# Patient Record
Sex: Female | Born: 1970 | Race: White | Hispanic: No | Marital: Single | Smoking: Former smoker
Health system: Southern US, Community
[De-identification: ages and names within clinical notes are randomized; demographics above are authoritative.]

## PROBLEM LIST (undated history)

## (undated) DIAGNOSIS — Z8742 Personal history of other diseases of the female genital tract: Secondary | ICD-10-CM

## (undated) DIAGNOSIS — R7989 Other specified abnormal findings of blood chemistry: Secondary | ICD-10-CM

## (undated) DIAGNOSIS — G5600 Carpal tunnel syndrome, unspecified upper limb: Secondary | ICD-10-CM

## (undated) HISTORY — DX: Personal history of other diseases of the female genital tract: Z87.42

## (undated) HISTORY — PX: APPENDECTOMY: SHX54

## (undated) HISTORY — DX: Carpal tunnel syndrome, unspecified upper limb: G56.00

## (undated) HISTORY — DX: Other specified abnormal findings of blood chemistry: R79.89

---

## 1998-04-22 ENCOUNTER — Encounter: Payer: Self-pay | Admitting: Family Medicine

## 1998-04-22 ENCOUNTER — Ambulatory Visit (HOSPITAL_COMMUNITY): Admission: RE | Admit: 1998-04-22 | Discharge: 1998-04-22 | Payer: Self-pay | Admitting: Family Medicine

## 1999-12-31 ENCOUNTER — Emergency Department (HOSPITAL_COMMUNITY): Admission: EM | Admit: 1999-12-31 | Discharge: 1999-12-31 | Payer: Self-pay | Admitting: *Deleted

## 2000-09-15 ENCOUNTER — Emergency Department (HOSPITAL_COMMUNITY): Admission: EM | Admit: 2000-09-15 | Discharge: 2000-09-15 | Payer: Self-pay | Admitting: *Deleted

## 2000-09-27 ENCOUNTER — Emergency Department (HOSPITAL_COMMUNITY): Admission: EM | Admit: 2000-09-27 | Discharge: 2000-09-27 | Payer: Self-pay | Admitting: Emergency Medicine

## 2000-11-01 ENCOUNTER — Emergency Department (HOSPITAL_COMMUNITY): Admission: EM | Admit: 2000-11-01 | Discharge: 2000-11-01 | Payer: Self-pay | Admitting: Emergency Medicine

## 2001-04-18 ENCOUNTER — Emergency Department (HOSPITAL_COMMUNITY): Admission: EM | Admit: 2001-04-18 | Discharge: 2001-04-18 | Payer: Self-pay | Admitting: Emergency Medicine

## 2001-05-24 ENCOUNTER — Emergency Department (HOSPITAL_COMMUNITY): Admission: EM | Admit: 2001-05-24 | Discharge: 2001-05-24 | Payer: Self-pay | Admitting: Emergency Medicine

## 2001-06-15 ENCOUNTER — Emergency Department (HOSPITAL_COMMUNITY): Admission: EM | Admit: 2001-06-15 | Discharge: 2001-06-15 | Payer: Self-pay | Admitting: Emergency Medicine

## 2001-06-15 ENCOUNTER — Encounter: Payer: Self-pay | Admitting: Emergency Medicine

## 2008-06-30 DIAGNOSIS — R7989 Other specified abnormal findings of blood chemistry: Secondary | ICD-10-CM

## 2008-06-30 HISTORY — DX: Other specified abnormal findings of blood chemistry: R79.89

## 2008-09-16 ENCOUNTER — Emergency Department (HOSPITAL_COMMUNITY): Admission: EM | Admit: 2008-09-16 | Discharge: 2008-09-17 | Payer: Self-pay | Admitting: Emergency Medicine

## 2009-09-04 ENCOUNTER — Emergency Department (HOSPITAL_BASED_OUTPATIENT_CLINIC_OR_DEPARTMENT_OTHER): Admission: EM | Admit: 2009-09-04 | Discharge: 2009-09-04 | Payer: Self-pay | Admitting: Emergency Medicine

## 2009-09-04 ENCOUNTER — Ambulatory Visit: Payer: Self-pay | Admitting: Radiology

## 2009-10-18 ENCOUNTER — Ambulatory Visit: Payer: Self-pay | Admitting: Nurse Practitioner

## 2009-10-18 ENCOUNTER — Inpatient Hospital Stay (HOSPITAL_COMMUNITY): Admission: AD | Admit: 2009-10-18 | Discharge: 2009-10-18 | Payer: Self-pay | Admitting: Obstetrics & Gynecology

## 2009-10-28 ENCOUNTER — Ambulatory Visit: Payer: Self-pay | Admitting: Obstetrics & Gynecology

## 2009-11-02 ENCOUNTER — Ambulatory Visit (HOSPITAL_COMMUNITY): Admission: RE | Admit: 2009-11-02 | Discharge: 2009-11-02 | Payer: Self-pay | Admitting: Obstetrics & Gynecology

## 2009-11-02 ENCOUNTER — Ambulatory Visit: Payer: Self-pay | Admitting: Obstetrics & Gynecology

## 2009-11-25 ENCOUNTER — Ambulatory Visit: Payer: Self-pay | Admitting: Obstetrics and Gynecology

## 2009-12-13 ENCOUNTER — Ambulatory Visit: Payer: Self-pay | Admitting: Family Medicine

## 2009-12-22 ENCOUNTER — Ambulatory Visit: Payer: Self-pay | Admitting: Family Medicine

## 2009-12-30 ENCOUNTER — Ambulatory Visit: Payer: Self-pay | Admitting: Obstetrics and Gynecology

## 2010-02-24 ENCOUNTER — Encounter (HOSPITAL_COMMUNITY)
Admission: RE | Admit: 2010-02-24 | Discharge: 2010-02-24 | Disposition: A | Discharge status: Home / Self Care | Payer: Self-pay | Source: Ambulatory Visit | Attending: Family Medicine | Admitting: Family Medicine

## 2010-02-24 DIAGNOSIS — Z01812 Encounter for preprocedural laboratory examination: Secondary | ICD-10-CM | POA: Insufficient documentation

## 2010-02-24 LAB — CBC
HCT: 45.6 % (ref 36.0–46.0)
Hemoglobin: 15.6 g/dL — ABNORMAL HIGH (ref 12.0–15.0)
MCHC: 34.2 g/dL (ref 30.0–36.0)
RBC: 5.07 MIL/uL (ref 3.87–5.11)
WBC: 11.1 10*3/uL — ABNORMAL HIGH (ref 4.0–10.5)

## 2010-02-24 LAB — SURGICAL PCR SCREEN
MRSA, PCR: NEGATIVE
Staphylococcus aureus: NEGATIVE

## 2010-03-03 ENCOUNTER — Ambulatory Visit (HOSPITAL_COMMUNITY)
Admission: RE | Admit: 2010-03-03 | Discharge: 2010-03-04 | Disposition: A | Discharge status: Home / Self Care | Payer: Self-pay | Source: Ambulatory Visit | Attending: Family Medicine | Admitting: Family Medicine

## 2010-03-03 ENCOUNTER — Other Ambulatory Visit: Payer: Self-pay | Admitting: Family Medicine

## 2010-03-03 ENCOUNTER — Ambulatory Visit (HOSPITAL_COMMUNITY): Payer: Self-pay

## 2010-03-03 DIAGNOSIS — N840 Polyp of corpus uteri: Secondary | ICD-10-CM | POA: Insufficient documentation

## 2010-03-03 DIAGNOSIS — N949 Unspecified condition associated with female genital organs and menstrual cycle: Secondary | ICD-10-CM

## 2010-03-03 DIAGNOSIS — E039 Hypothyroidism, unspecified: Secondary | ICD-10-CM | POA: Insufficient documentation

## 2010-03-03 DIAGNOSIS — N8300 Follicular cyst of ovary, unspecified side: Secondary | ICD-10-CM

## 2010-03-03 DIAGNOSIS — N803 Endometriosis of pelvic peritoneum, unspecified: Secondary | ICD-10-CM | POA: Insufficient documentation

## 2010-03-03 DIAGNOSIS — N200 Calculus of kidney: Secondary | ICD-10-CM | POA: Insufficient documentation

## 2010-03-03 LAB — PREGNANCY, URINE: Preg Test, Ur: NEGATIVE

## 2010-03-04 LAB — CBC
Hemoglobin: 13.5 g/dL (ref 12.0–15.0)
MCH: 30.2 pg (ref 26.0–34.0)
Platelets: 219 10*3/uL (ref 150–400)
RBC: 4.47 MIL/uL (ref 3.87–5.11)
WBC: 16.1 10*3/uL — ABNORMAL HIGH (ref 4.0–10.5)

## 2010-03-07 HISTORY — PX: ABDOMINAL HYSTERECTOMY: SHX81

## 2010-03-07 HISTORY — PX: BILATERAL OOPHORECTOMY: SHX1221

## 2010-03-07 NOTE — Discharge Summary (Signed)
  NAMECHARVI, [name]            ACCOUNT NO.:  [id number]  MEDICAL RECORD NO.:  [id number]           PATIENT TYPE:  O  LOCATION:  9306                          FACILITY:  [facility]  PHYSICIAN:  Lakevia Perris S. Shawnie Pons, M.D.   DATE OF BIRTH:  04-21-1970  DATE OF ADMISSION:  03/03/2010 DATE OF DISCHARGE:  03/04/2010                              DISCHARGE SUMMARY   FINAL DIAGNOSES: 1. Chronic pelvic pain. 2. Endometriosis. 3. Hypothyroidism. 4. Borderline personality disorder. 5. Nephrolithiasis. 6. Bipolar disorder.  PERTINENT LABS:  Negative urine pregnancy test.  Preoperative hemoglobin of 15.6, postoperative hemoglobin of 13.5.  Negative MRSA screen.  Other pertinent findings include a large globular uterus and obesity.  PROCEDURES:  The patient underwent a total laparoscopic hysterectomy with BSO.  CONSULTS:  None.  REASON FOR ADMISSION:  Briefly, please see H and P on the chart.  The patient is a 40 year old nulliparous patient who has a history of endometriosis biopsy proven by laparoscopy in October of last year.  She was also found to have adhesions as well.  The patient has continued lower pelvic pain and desired definitive treatment including bilateral salpingo-oophorectomy.  Discussion was had with the patient about risks and benefits.  The patient understood that and desires to proceed.  HOSPITAL COURSE:  The patient was admitted, she underwent the above procedure.  Please see operative report for full details. Postoperatively, she was transferred to the floor.  She had her Foley catheter and had a low dose PCA.  Overnight, she did well.  She had excellent urine output and her Foley was removed in the morning.  She was ambulating, voiding, and eating without difficulty and felt stable for discharge.  DISCHARGE DISPOSITION AND CONDITION:  The patient discharged home in good condition.  Followup will be in the GYN Clinic on March 18, 2010, at 9:30 in  the morning.  DISCHARGE MEDICATIONS:  Continuation of her diclofenac 75 mg 1-2 tablets daily, diphenhydramine 50 mg 2-4 tablets daily if needed for allergies, continuation of trazodone 75 mg at bedtime for sleep, Klonopin 1 mg at bedtime for sleep as well.  New prescriptions given are Percocet 5/325 one to two p.o. q.4-6 h. p.r.n. pain #42 given, Ultram 50 mg one every 6 hours as needed for left pain #48 plus one refill are given, estrogen patch 0.1 mg applied a patch weekly #4 with p.r.n. refills given.  The patient is also instructed to return with fever greater that 101, persistent nausea, vomiting, or significant abdominal pain.     Shelbie Proctor. Shawnie Pons, M.D.     TSP/MEDQ  D:  03/04/2010  T:  03/04/2010  Job:  [id number]  Electronically Signed by Tinnie Gens M.D. on 03/07/2010 11:02:14 AM

## 2010-03-07 NOTE — Op Note (Signed)
[name], [name]            ACCOUNT NO.:  [id number]  MEDICAL RECORD NO.:  [id number]           PATIENT TYPE:  O  LOCATION:  9306                          [facility]ILITY:  [facility]  PHYSICIAN:  Krishna Heuer S. Shawnie Pons, M.D.   DATE OF BIRTH:  [birth year]  DATE OF PROCEDURE:  03/03/2010 DATE OF DISCHARGE:  02/24/2010                              OPERATIVE REPORT   PREOPERATIVE DIAGNOSIS:  Endometriosis and chronic pelvic pain.  POSTOPERATIVE DIAGNOSIS:  Endometriosis and chronic pelvic pain.  PROCEDURE:  Total abdominal hysterectomy with bilateral salpingo- oophorectomy.  SURGEON:  Shelbie Proctor. Shawnie Pons, MD  ASSISTANTS:  Lazaro Arms, MD and Myra C. Marice Potter, MD  ANESTHESIA:  General and local.  FINDINGS:  Globular-appearing uterus, small cyst on the patient's right ovary.  SPECIMENS:  Uterus, tubes, and ovaries to Pathology.  BLOOD LOSS:  250 mL.  COMPLICATIONS:  None known.  REASON FOR PROCEDURE:  Briefly, the patient is a 40 year old nulliparous patient who has a history of endometriosis that is pathology proved by laparoscopy done in October.  She had adhesions at that time and the patient was interested in definitive treatment.  She apparently has a history of chronic pain, requiring Percocet use and she is trying to get off that if possible.  The patient was counseled regarding risks and benefits of the procedures and alternatives and the patient really desired definitive therapy.  PROCEDURE:  The patient was taken to the OR.  She was placed in dorsal lithotomy in [place] stirrups.  She was prepped and draped in usual sterile fashion with ChloraPrep on the abdomen and Betadine of the perineum.  Foley catheter was placed inside the bladder.  Time-out was performed.  The patient had received a gram of Ancef.  SCDs were in place.  Speculum was placed inside the vagina.  The cervix visualized. A ZUMI was placed through the uterus for uterine manipulation. Attention was then turned to the  abdomen.  4 mL of 0.25% Marcaine were injected at the umbilicus.  A vertical incision was made through the umbilicus to the underlying fascia and the peritoneal cavity entered bluntly.  The edges of the fascia were then tied with 0 Vicryl suture on the UR6 and Hasson trocar placed through this incision.  A pneumoperitoneum was created.  Attention was then turned to the lower quadrants where two Excel trocars of 10-mm size were placed under direct visualization of the lower quadrants bilaterally.  The large single- tooth tenaculum was then used to grasp the uterus and harmonic scalpel was used to take down the tubo-ovarian pedicles and the rounds.  This started very laterally, so that the uterine artery could be taken high on the uterus and very medially which it was.  Then attention was turned to the patient's left side where the tubo-ovarian pedicle, the round ligament was taken down.  The bladder flap was created and pushed down. The uterine artery was taken on this side and some bleeding was noted from this side, which had to be further cauterized.  2-3 bites were taken on the patient's left side to go down the cervix with the harmonic scalpel and then  the vagina was entered sharply with the harmonic scalpel.  Similarly, several bites were taken down on the cervix on the patient's right and the vagina was entered and with care being taken to hug the cervix.  The vagina was opened in sequential bites circumferentially until the uterus was removed.  Pneumoperitoneum was lost, but there is a hole the vagina and a wet towel was placed in the vagina.  The uterus was then placed over the hole and IP ligaments were taken and the tubes and ovaries removed.  These were all removed from below and a wet towel placed back into the vagina and pneumoperitoneum recreated.  An Endo suture was used to close the vaginal cuff.  The first Endo suture did not fire and never even took a bite of tissue,  so this one was removed and the suture as well.  When the suture was removed, the needle could not be found that was supposed to be attached to it.  A second Endo suture was used and the vaginal angles were gotten and one across the middle which effectively closed the vaginal cuff.  The suture being tied extracorporeally and pushed down with a knot pusher.  At the end of the case, the abdomen was inspected.  There did not appear to be any foreign bodies; however, given the inability to find the needle, an x-ray was obtained.  The x-rays revealed no foreign body in the abdomen and half of the needle was found still in the device that had misfired.  The trocars were then all removed.  Two lower quadrant ports were closed with 3-0 Vicryl in a subcuticular fashion followed by Dermabond.  The fascia at the umbilicus was closed with the aforementioned 0 Vicryl sutures on the UR6 and 2 interrupted to close the fascia there.  The subcutaneous tissue was also closed with 0 Vicryl suture in excellent subcuticular fashion followed by Dermabond.  With the exception of the half of the needle from the Endo suture device that could not be found, all other instrument, needle, and lap counts were correct x2.  The patient was awakened and taken to recovery room in stable condition.     Shelbie Proctor. Shawnie Pons, M.D.     TSP/MEDQ  D:  03/03/2010  T:  03/04/2010  Job:  [id number]  Electronically Signed by Tinnie Gens M.D. on 03/07/2010 11:01:45 AM

## 2010-03-18 ENCOUNTER — Ambulatory Visit: Payer: Self-pay | Admitting: Family Medicine

## 2010-03-22 LAB — POCT PREGNANCY, URINE: Preg Test, Ur: NEGATIVE

## 2010-03-23 LAB — CBC
HCT: 42.8 % (ref 36.0–46.0)
Hemoglobin: 14.7 g/dL (ref 12.0–15.0)
MCHC: 34.3 g/dL (ref 30.0–36.0)
WBC: 13.7 10*3/uL — ABNORMAL HIGH (ref 4.0–10.5)

## 2010-03-23 LAB — PREGNANCY, URINE: Preg Test, Ur: NEGATIVE

## 2010-03-23 LAB — SURGICAL PCR SCREEN
MRSA, PCR: NEGATIVE
Staphylococcus aureus: NEGATIVE

## 2010-03-24 LAB — DIFFERENTIAL
Basophils Relative: 0 % (ref 0–1)
Lymphocytes Relative: 20 % (ref 12–46)
Lymphs Abs: 2.1 10*3/uL (ref 0.7–4.0)
Monocytes Relative: 6 % (ref 3–12)
Neutro Abs: 7.5 10*3/uL (ref 1.7–7.7)
Neutrophils Relative %: 72 % (ref 43–77)

## 2010-03-24 LAB — CBC
HCT: 44 % (ref 36.0–46.0)
MCHC: 34.2 g/dL (ref 30.0–36.0)
MCV: 88.9 fL (ref 78.0–100.0)
Platelets: 184 10*3/uL (ref 150–400)
RDW: 13 % (ref 11.5–15.5)

## 2010-03-24 LAB — URINALYSIS, ROUTINE W REFLEX MICROSCOPIC
Bilirubin Urine: NEGATIVE
Hgb urine dipstick: NEGATIVE
Nitrite: NEGATIVE
Protein, ur: NEGATIVE mg/dL
Urobilinogen, UA: 0.2 mg/dL (ref 0.0–1.0)

## 2010-03-24 LAB — COMPREHENSIVE METABOLIC PANEL
BUN: 14 mg/dL (ref 6–23)
Calcium: 9 mg/dL (ref 8.4–10.5)
Creatinine, Ser: 0.66 mg/dL (ref 0.4–1.2)
Glucose, Bld: 100 mg/dL — ABNORMAL HIGH (ref 70–99)
Total Protein: 7 g/dL (ref 6.0–8.3)

## 2010-03-24 LAB — POCT PREGNANCY, URINE: Preg Test, Ur: NEGATIVE

## 2010-03-25 LAB — URINE MICROSCOPIC-ADD ON

## 2010-03-25 LAB — URINE CULTURE: Colony Count: 40000

## 2010-03-25 LAB — BASIC METABOLIC PANEL
CO2: 21 mEq/L (ref 19–32)
Chloride: 110 mEq/L (ref 96–112)
GFR calc non Af Amer: >60 mL/min (ref >60)
Glucose, Bld: 82 mg/dL (ref 70–99)
Potassium: 4.1 mEq/L (ref 3.5–5.1)
Sodium: 140 mEq/L (ref 135–145)

## 2010-03-25 LAB — URINALYSIS, ROUTINE W REFLEX MICROSCOPIC
Specific Gravity, Urine: 1.036 — ABNORMAL HIGH (ref 1.005–1.030)
Urobilinogen, UA: 1 mg/dL (ref 0.0–1.0)

## 2010-04-10 ENCOUNTER — Inpatient Hospital Stay (HOSPITAL_COMMUNITY)
Admission: AD | Admit: 2010-04-10 | Discharge: 2010-04-10 | Discharge status: Against Medical Advice | Payer: Self-pay | Source: Ambulatory Visit | Attending: Obstetrics & Gynecology | Admitting: Obstetrics & Gynecology

## 2010-04-10 DIAGNOSIS — M79609 Pain in unspecified limb: Secondary | ICD-10-CM | POA: Insufficient documentation

## 2010-04-10 DIAGNOSIS — I809 Phlebitis and thrombophlebitis of unspecified site: Secondary | ICD-10-CM | POA: Insufficient documentation

## 2010-04-12 ENCOUNTER — Emergency Department (HOSPITAL_COMMUNITY)
Admission: EM | Admit: 2010-04-12 | Discharge: 2010-04-12 | Disposition: A | Discharge status: Home / Self Care | Payer: Self-pay | Attending: Emergency Medicine | Admitting: Emergency Medicine

## 2010-04-12 DIAGNOSIS — I808 Phlebitis and thrombophlebitis of other sites: Secondary | ICD-10-CM | POA: Insufficient documentation

## 2010-04-12 DIAGNOSIS — F319 Bipolar disorder, unspecified: Secondary | ICD-10-CM | POA: Insufficient documentation

## 2010-04-12 DIAGNOSIS — M79609 Pain in unspecified limb: Secondary | ICD-10-CM | POA: Insufficient documentation

## 2010-04-12 DIAGNOSIS — E039 Hypothyroidism, unspecified: Secondary | ICD-10-CM | POA: Insufficient documentation

## 2010-04-15 LAB — POCT PREGNANCY, URINE: Preg Test, Ur: NEGATIVE

## 2010-04-29 ENCOUNTER — Ambulatory Visit: Payer: Self-pay | Admitting: Family Medicine

## 2010-11-06 ENCOUNTER — Encounter: Payer: Self-pay | Admitting: *Deleted

## 2010-11-06 ENCOUNTER — Emergency Department (HOSPITAL_BASED_OUTPATIENT_CLINIC_OR_DEPARTMENT_OTHER)
Admission: EM | Admit: 2010-11-06 | Discharge: 2010-11-06 | Disposition: A | Discharge status: Home / Self Care | Payer: Self-pay | Attending: Emergency Medicine | Admitting: Emergency Medicine

## 2010-11-06 DIAGNOSIS — J069 Acute upper respiratory infection, unspecified: Secondary | ICD-10-CM | POA: Insufficient documentation

## 2010-11-06 DIAGNOSIS — R059 Cough, unspecified: Secondary | ICD-10-CM | POA: Insufficient documentation

## 2010-11-06 DIAGNOSIS — R05 Cough: Secondary | ICD-10-CM | POA: Insufficient documentation

## 2010-11-06 MED ORDER — ALBUTEROL SULFATE HFA 108 (90 BASE) MCG/ACT IN AERS: 1.0000 | INHALATION_SPRAY | Freq: Four times a day (QID) | RESPIRATORY_TRACT | Status: DC | PRN

## 2010-11-06 MED ORDER — ACETAMINOPHEN-CODEINE 120-12 MG/5ML PO SUSP: 5.0000 mL | Freq: Four times a day (QID) | ORAL | Status: AC | PRN

## 2010-11-06 MED ORDER — IBUPROFEN 800 MG PO TABS: 800.0000 mg | ORAL_TABLET | Freq: Three times a day (TID) | ORAL | Status: AC

## 2010-11-06 NOTE — ED Provider Notes (Signed)
History    Scribed for Sunnie Nielsen, MD, the patient was seen in room MH08/MH08. This chart was scribed by Katha Cabal.   CSN: [id number] Arrival date & time: 11/06/2010  8:19 PM   First MD Initiated Contact with Patient 11/06/10 2051      Chief Complaint  Patient presents with  . Influenza    (Consider location/radiation/quality/duration/timing/severity/associated sxs/prior treatment) HPI [name] is a 40 y.o. female who presents to the Emergency Department complaining of gradual onset of moderate persistent cough with associated fever with chills, sore throat and myalgia for the past 4 days.  Patient taking Mucinex and Nyquil.  No chronic medical conditions.  Moderate in severity. Persistent since onset. No alleviating or worsening factors.   History reviewed. No pertinent past medical history.  Past Surgical History  Procedure Date  . Abdominal hysterectomy     History reviewed. No pertinent family history.  History  Substance Use Topics  . Smoking status: Never Smoker   . Smokeless tobacco: Not on file  . Alcohol Use: Yes    OB History    Grav Para Term Preterm Abortions TAB SAB Ect Mult Living                  Review of Systems  Constitutional: Negative for fever and chills.  HENT: Negative for drooling, neck pain, neck stiffness, dental problem and sinus pressure.   Eyes: Negative for pain.  Respiratory: Positive for cough. Negative for shortness of breath.   Cardiovascular: Negative for chest pain.  Gastrointestinal: Negative for abdominal pain.  Genitourinary: Negative for dysuria.  Musculoskeletal: Negative for back pain.  Skin: Negative for rash.  Neurological: Negative for headaches.  All other systems reviewed and are negative.   10 Systems reviewed and are negative for acute change except as noted in the HPI.  Allergies  Codeine  Home Medications   Current Outpatient Rx  Name Route Sig Dispense Refill  . CLONAZEPAM 0.5 MG PO  TABS Oral Take 0.5 mg by mouth at bedtime as needed.      Marland Kitchen LEVOTHYROXINE SODIUM 25 MCG PO TABS Oral Take 25 mcg by mouth daily.      . TRAZODONE HCL PO Oral Take 75 mg by mouth once.        BP 134/82  Pulse 90  Temp(Src) 98.2 F (36.8 C) (Oral)  Resp 20  Ht 5\' 7"  (1.702 m)  Wt 190 lb (86.183 kg)  BMI 29.76 kg/m2  SpO2 99%  Physical Exam  Constitutional: She is oriented to person, place, and time. She appears well-developed and well-nourished.  Non-toxic appearance. She does not have a sickly appearance. No distress.  HENT:  Head: Normocephalic and atraumatic.  Mouth/Throat: Oropharynx is clear and moist.  Eyes: EOM and lids are normal. Pupils are equal, round, and reactive to light. No scleral icterus.  Neck: Trachea normal and normal range of motion. Neck supple.  Cardiovascular: Normal rate, regular rhythm and normal heart sounds.   Pulmonary/Chest: Effort normal and breath sounds normal. No respiratory distress. She has no wheezes.       Upper airway congestion, no stridor   Abdominal: Soft. Normal appearance. There is no tenderness. There is no rebound, no guarding and no CVA tenderness.  Musculoskeletal: Normal range of motion.  Neurological: She is alert and oriented to person, place, and time. She has normal strength.  Skin: Skin is warm, dry and intact. No rash noted.  Psychiatric: She has a normal mood and affect. Her  behavior is normal.    ED Course  Procedures (including critical care time)   DIAGNOSTIC STUDIES: Oxygen Saturation is 99% on room air, normal by my interpretation.    COORDINATION OF CARE:  9:01 PM  Physical exam complete.  Will discharge patient home clinical URI       MDM   For cough, congestion and reported fevers has clinical viral URI by evaluation. She tolerates fluids without emesis. Motrin given and prescription for Tylenol with Codeine provided. Reliable historian states understanding all discharge and followup instructions, and  strict return precautions for any worsening condition.    I personally performed the services described in this documentation, which was scribed in my presence. The recorded information has been reviewed and considered.             Sunnie Nielsen, MD 11/06/10 2322

## 2010-11-06 NOTE — ED Notes (Signed)
Pt describes flu-like s/s for 4 days. (cough, fever , aches)

## 2011-02-21 ENCOUNTER — Other Ambulatory Visit: Payer: Self-pay | Admitting: Physician Assistant

## 2011-02-24 ENCOUNTER — Other Ambulatory Visit: Payer: Self-pay | Admitting: Physician Assistant

## 2011-06-14 ENCOUNTER — Other Ambulatory Visit: Payer: Self-pay | Admitting: Physician Assistant

## 2011-06-14 MED ORDER — CLONAZEPAM 2 MG PO TABS: ORAL_TABLET | ORAL | Status: DC

## 2011-10-20 ENCOUNTER — Other Ambulatory Visit: Payer: Self-pay | Admitting: Physician Assistant

## 2011-10-23 NOTE — Telephone Encounter (Signed)
Patients chart is at the nurses station in the pa pool pile.  [facility] [id number]

## 2011-10-23 NOTE — Telephone Encounter (Signed)
Patients chart is at the nurses station in the pa pool pile.  [facility] [id number]

## 2011-10-25 NOTE — Telephone Encounter (Signed)
Patient not seen here since our [facility] go-live.  I need the paper chart for review.

## 2011-10-31 NOTE — Telephone Encounter (Signed)
Pt is getting low on her medication and wanted to let us know she cant sleep without this medication, she states she has been taking it since 2010. [phone]

## 2011-11-22 ENCOUNTER — Telehealth: Payer: Self-pay

## 2011-11-22 MED ORDER — TRAZODONE HCL 50 MG PO TABS: 50.0000 mg | ORAL_TABLET | Freq: Every evening | ORAL | Status: DC | PRN

## 2011-11-22 MED ORDER — CLONAZEPAM 0.5 MG PO TABS: 0.5000 mg | ORAL_TABLET | Freq: Every day | ORAL | Status: DC

## 2011-11-22 NOTE — Telephone Encounter (Signed)
LMOM to CB. Rxs were sent to [facility] [address] in [place]. Ask pt if we need to change this to [facility] or if the [place] location is OK.

## 2011-11-22 NOTE — Telephone Encounter (Signed)
Called in for her

## 2011-11-22 NOTE — Telephone Encounter (Signed)
Pt of [name] Jeffery.   Would like to get refills on Clonopin 1/2 mg, and Trazodone 50 mg.  Just wanting a partial refill on both until she can be seen.  Date of last visit 11/18/10  [id number].  [phone] best number.  [facility] [address].

## 2011-11-22 NOTE — Telephone Encounter (Signed)
Traodone rx sent, please fax/call in clonazepam (printed).

## 2011-11-22 NOTE — Telephone Encounter (Signed)
[name], do you want to give any RFs? Pt's chart is in your box.

## 2011-11-22 NOTE — Addendum Note (Signed)
Addended by: Sheppard Plumber A on: 11/22/2011 04:31 PM   Modules accepted: Orders

## 2011-11-22 NOTE — Telephone Encounter (Signed)
Notified pt that Rxs were sent in to cover her another month to allow her time to RTC. Pt agreed. Pt did ask for Rxs to be changed to [facility]. I resent/called Rxs to correct pharmacy.

## 2011-12-19 ENCOUNTER — Ambulatory Visit (INDEPENDENT_AMBULATORY_CARE_PROVIDER_SITE_OTHER): Payer: No Typology Code available for payment source | Admitting: Physician Assistant

## 2011-12-19 VITALS — BP 112/80 | HR 83 | Temp 98.8°F | Resp 16 | Ht 67.0 in | Wt 196.0 lb

## 2011-12-19 DIAGNOSIS — Z1231 Encounter for screening mammogram for malignant neoplasm of breast: Secondary | ICD-10-CM

## 2011-12-19 DIAGNOSIS — R5383 Other fatigue: Secondary | ICD-10-CM

## 2011-12-19 DIAGNOSIS — Z1211 Encounter for screening for malignant neoplasm of colon: Secondary | ICD-10-CM

## 2011-12-19 DIAGNOSIS — E039 Hypothyroidism, unspecified: Secondary | ICD-10-CM

## 2011-12-19 DIAGNOSIS — G5600 Carpal tunnel syndrome, unspecified upper limb: Secondary | ICD-10-CM | POA: Insufficient documentation

## 2011-12-19 DIAGNOSIS — G8929 Other chronic pain: Secondary | ICD-10-CM

## 2011-12-19 DIAGNOSIS — R5381 Other malaise: Secondary | ICD-10-CM

## 2011-12-19 DIAGNOSIS — M67439 Ganglion, unspecified wrist: Secondary | ICD-10-CM

## 2011-12-19 DIAGNOSIS — G4700 Insomnia, unspecified: Secondary | ICD-10-CM

## 2011-12-19 DIAGNOSIS — Z0000 Encounter for general adult medical examination without abnormal findings: Secondary | ICD-10-CM

## 2011-12-19 DIAGNOSIS — M549 Dorsalgia, unspecified: Secondary | ICD-10-CM

## 2011-12-19 DIAGNOSIS — M6740 Ganglion, unspecified site: Secondary | ICD-10-CM

## 2011-12-19 DIAGNOSIS — R195 Other fecal abnormalities: Secondary | ICD-10-CM

## 2011-12-19 DIAGNOSIS — Z1239 Encounter for other screening for malignant neoplasm of breast: Secondary | ICD-10-CM

## 2011-12-19 LAB — COMPREHENSIVE METABOLIC PANEL
ALT: 30 U/L (ref 0–35)
AST: 20 U/L (ref 0–37)
Albumin: 4.6 g/dL (ref 3.5–5.2)
CO2: 27 mEq/L (ref 19–32)
Calcium: 9.7 mg/dL (ref 8.4–10.5)
Chloride: 104 mEq/L (ref 96–112)
Potassium: 4.3 mEq/L (ref 3.5–5.3)
Sodium: 137 mEq/L (ref 135–145)
Total Protein: 7.1 g/dL (ref 6.0–8.3)

## 2011-12-19 LAB — POCT UA - MICROSCOPIC ONLY: Crystals, Ur, HPF, POC: NEGATIVE

## 2011-12-19 LAB — POCT CBC
Granulocyte percent: 73.8 %G (ref 37–80)
HCT, POC: 52.2 % — AB (ref 37.7–47.9)
MCV: 94.6 fL (ref 80–97)
MID (cbc): 0.7 (ref 0–0.9)
Platelet Count, POC: 272 10*3/uL (ref 142–424)
RBC: 5.52 M/uL — AB (ref 4.04–5.48)

## 2011-12-19 LAB — LIPID PANEL
LDL Cholesterol: 120 mg/dL — ABNORMAL HIGH (ref 0–99)
Triglycerides: 104 mg/dL (ref <150)
VLDL: 21 mg/dL (ref 0–40)

## 2011-12-19 LAB — TSH: TSH: 3.911 u[IU]/mL (ref 0.350–4.500)

## 2011-12-19 LAB — IFOBT (OCCULT BLOOD): IFOBT: POSITIVE

## 2011-12-19 MED ORDER — TRAZODONE HCL 150 MG PO TABS: 150.0000 mg | ORAL_TABLET | Freq: Every day | ORAL | Status: DC

## 2011-12-19 MED ORDER — MELOXICAM 15 MG PO TABS: 15.0000 mg | ORAL_TABLET | Freq: Every day | ORAL | Status: DC

## 2011-12-19 MED ORDER — CLONAZEPAM 2 MG PO TABS: ORAL_TABLET | ORAL | Status: DC

## 2011-12-19 NOTE — Progress Notes (Signed)
Subjective:    Patient ID: [name], female    DOB: [birth year], 41 y.o.   MRN: [id number]  HPI  This 41 y.o. female presents for Annual Wellness exam. Last wellness visit was prior to her TAH in 02/2010.  Her last visit here was 11/18/2010.    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  Carpal tunnel syndrome flared up, but is controlled again with use of her night splints.  She is beginning to develop a bump on the inside of her RIGHT wrist, which she believes is a ganglion cyst. Has spoken with an orthopedic surgeon already who has advised her of options.  No new sexual contacts since her last STI screening in 04/2009. However, it is of note that she is NOT immune to hepatitis B.  Past Medical History  Diagnosis Date  . Elevated TSH 06/30/2008    took levothyroxine briefly in 2010  . Hx of endometriosis     s/p TAH + oophorectomy    Past Surgical History  Procedure Date  . Abdominal hysterectomy 03/07/2010  . Bilateral oophorectomy 03/07/2010  . Appendectomy     Prior to Admission medications   Medication Sig Start Date End Date Taking? Authorizing Provider  clonazePAM (KLONOPIN) 0.5 MG tablet Take 1 tablet (0.5 mg total) by mouth at bedtime. Need office visit for additional refills. 11/22/11  Yes Shakia Sebastiano S Ricka Westra, PA-C  traZODone (DESYREL) 50 MG tablet Take 1 tablet (50 mg total) by mouth at bedtime as needed for sleep. Need office visit for additional refills. 11/22/11  Yes Livi Mcgann S Janele Lague, PA-C  levothyroxine (SYNTHROID, LEVOTHROID) 25 MCG tablet Take 25 mcg by mouth daily.      Historical Provider, MD    Allergies  Allergen Reactions  . Codeine Itching    History   Social History  . Marital Status: Single    Spouse Name: n/a    Number of Children: 0  . Years of Education: 14   Occupational History  . HAIR DRESSER    Social History Main Topics  . Smoking status: Former Smoker    Types: Cigarettes  . Smokeless tobacco: Never Used     Comment:  uses e-cigarette BID  . Alcohol Use: 2.4 oz/week    4 Cans of beer per week  . Drug Use: No  . Sexually Active: Yes -- Female partner(s)    Birth Control/ Protection: Surgical   Other Topics Concern  . Not on file   Social History Narrative   Lives alone with her dog, Alison Stalling.    Family History  Problem Relation Age of Onset  . Adopted: Yes  . Family history unknown: Yes    Review of Systems  Constitutional: Positive for fatigue (chronic insomnia) and unexpected weight change (weight gain). Negative for fever, chills, diaphoresis, activity change and appetite change.  HENT: Negative.   Eyes: Negative.   Respiratory: Negative.   Cardiovascular: Negative.   Gastrointestinal: Positive for diarrhea ("I think I'm lactose intolerant.  I love cheese, but it doesn't love me."). Negative for nausea, vomiting, abdominal pain, constipation, blood in stool, abdominal distention, anal bleeding and rectal pain.  Genitourinary: Negative.   Musculoskeletal: Positive for myalgias and arthralgias.       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  Some improvement with core strengthening.  Skin: Negative.   Neurological: Negative.   Hematological: Negative.   Psychiatric/Behavioral: Negative.        Objective:  Physical Exam  Vitals reviewed. Constitutional: She is oriented to person, place, and time. Vital signs are normal. She appears well-developed and well-nourished. No distress.  HENT:  Head: Normocephalic and atraumatic.  Right Ear: Hearing, tympanic membrane, external ear and ear canal normal. No foreign bodies.  Left Ear: Hearing, tympanic membrane, external ear and ear canal normal. No foreign bodies.  Nose: Nose normal.  Mouth/Throat: Uvula is midline, oropharynx is clear and moist and mucous membranes are normal. No oral lesions. Normal dentition. No dental abscesses or uvula swelling. No oropharyngeal exudate.  Eyes: Conjunctivae normal, EOM  and lids are normal. Pupils are equal, round, and reactive to light. Right eye exhibits no discharge. Left eye exhibits no discharge. No scleral icterus.  Fundoscopic exam:      The right eye shows no arteriolar narrowing, no AV nicking, no exudate, no hemorrhage and no papilledema. The right eye shows red reflex.The right eye shows no venous pulsations.      The left eye shows no arteriolar narrowing, no AV nicking, no exudate, no hemorrhage and no papilledema. The left eye shows red reflex.The left eye shows no venous pulsations. Neck: Trachea normal, normal range of motion and full passive range of motion without pain. Neck supple. No spinous process tenderness and no muscular tenderness present. No mass and no thyromegaly present.  Cardiovascular: Normal rate, regular rhythm, normal heart sounds, intact distal pulses and normal pulses.   Pulmonary/Chest: Effort normal and breath sounds normal. She exhibits no tenderness and no retraction. Right breast exhibits no inverted nipple, no mass, no nipple discharge, no skin change and no tenderness. Left breast exhibits no inverted nipple, no mass, no nipple discharge, no skin change and no tenderness. Breasts are symmetrical.  Abdominal: Soft. Normal appearance and bowel sounds are normal. She exhibits no distension and no mass. There is no hepatosplenomegaly. There is no tenderness. There is no rigidity, no rebound, no guarding, no CVA tenderness, no tenderness at McBurney's point and negative Murphy's sign. No hernia. Hernia confirmed negative in the right inguinal area and confirmed negative in the left inguinal area.  Genitourinary: Rectum normal, vagina normal and uterus normal. Rectal exam shows no external hemorrhoid and no fissure. No breast swelling, tenderness, discharge or bleeding. Pelvic exam was performed with patient supine. No labial fusion. There is no rash, tenderness, lesion or injury on the right labia. There is no rash, tenderness, lesion  or injury on the left labia. Right adnexum displays no mass, no tenderness and no fullness. Left adnexum displays no mass, no tenderness and no fullness. No erythema, tenderness or bleeding around the vagina. No foreign body around the vagina. No signs of injury around the vagina. No vaginal discharge found.       Cervix is surgically absent.  Musculoskeletal: She exhibits no edema and no tenderness.       Cervical back: Normal.       Thoracic back: Normal.       Lumbar back: Normal.  Lymphadenopathy:       Head (right side): No tonsillar, no preauricular, no posterior auricular and no occipital adenopathy present.       Head (left side): No tonsillar, no preauricular, no posterior auricular and no occipital adenopathy present.    She has no cervical adenopathy.    She has no axillary adenopathy.       Right: No inguinal and no supraclavicular adenopathy present.       Left: No inguinal and no supraclavicular adenopathy present.  Neurological: She is alert and oriented to person, place, and time. She has normal strength and normal reflexes. No cranial nerve deficit. She exhibits normal muscle tone. Coordination and gait normal.  Skin: Skin is warm, dry and intact. No rash noted. She is not diaphoretic. No cyanosis or erythema. Nails show no clubbing.  Psychiatric: Her speech is normal and behavior is normal. Judgment and thought content normal. Her mood appears anxious. Her affect is not angry, not blunt, not labile and not inappropriate. She does not exhibit a depressed mood.   Results for orders placed in visit on 12/19/11  IFOBT (OCCULT BLOOD)      Component Value Range   IFOBT Positive    POCT CBC      Component Value Range   WBC 13.1 (*) 4.6 - 10.2 K/uL   Lymph, poc 2.7  0.6 - 3.4   POC LYMPH PERCENT 20.9  10 - 50 %L   MID (cbc) 0.7  0 - 0.9   POC MID % 5.3  0 - 12 %M   POC Granulocyte 9.7 (*) 2 - 6.9   Granulocyte percent 73.8  37 - 80 %G   RBC 5.52 (*) 4.04 - 5.48 M/uL    Hemoglobin 16.7 (*) 12.2 - 16.2 g/dL   HCT, POC 16.1 (*) 09.6 - 47.9 %   MCV 94.6  80 - 97 fL   MCH, POC 30.3  27 - 31.2 pg   MCHC 32.0  31.8 - 35.4 g/dL   RDW, POC 04.5     Platelet Count, POC 272  142 - 424 K/uL   MPV 12.6  0 - 99.8 fL  POCT UA - MICROSCOPIC ONLY      Component Value Range   WBC, Ur, HPF, POC 1-2     RBC, urine, microscopic 3-8     Bacteria, U Microscopic trace     Mucus, UA positive     Epithelial cells, urine per micros 4-6     Crystals, Ur, HPF, POC neg     Casts, Ur, LPF, POC neg     Yeast, UA neg         Assessment & Plan:   1. Routine general medical examination at a health care facility  POCT UA - Microscopic Only, POCT urinalysis dipstick, Lipid panel  2. Hypothyroidism  TSH; suspect we need to restart levothyroxine  3. Insomnia  clonazePAM (KLONOPIN) 2 MG tablet, traZODone (DESYREL) 150 MG tablet  4. Fatigue  POCT CBC, Comprehensive metabolic panel, Vitamin D 25 hydroxy  5. Ganglion cyst of wrist  Anticipatory guidance.  No treatment desired at present.  6. Upper back pain, chronic  meloxicam (MOBIC) 15 MG tablet; continue with core exercises and improved ergonomics.  7. Carpal tunnel syndrome  Continue with wrist splints at night.  8. Heme positive stool  Ambulatory referral to Gastroenterology  9. Screening for colon cancer  IFOBT POC (occult bld, rslt in office)  10. Screening for breast cancer  MM Digital Screening   Age appropriate anticipatory guidance provided. RTC in 6 months, sooner if needed.

## 2011-12-19 NOTE — Patient Instructions (Addendum)

## 2011-12-20 ENCOUNTER — Encounter: Payer: Self-pay | Admitting: Gastroenterology

## 2011-12-24 ENCOUNTER — Telehealth: Payer: Self-pay

## 2011-12-24 NOTE — Telephone Encounter (Signed)
Pt called for lab results. States if she is unable to answer her phone, please leave detailed message. Also asked if labs had been entered into mychart  bf

## 2011-12-25 NOTE — Telephone Encounter (Signed)
[name], please advise 

## 2011-12-25 NOTE — Telephone Encounter (Signed)
Can we release labs to my chart?

## 2011-12-26 NOTE — Telephone Encounter (Signed)
I hadn't released her results to My Chart yet, since I'm waiting on her pap, but I'll do that today.

## 2011-12-29 ENCOUNTER — Telehealth: Payer: Self-pay | Admitting: Radiology

## 2011-12-29 NOTE — Telephone Encounter (Signed)
If she was NOT taking the thyroid medication for 6 months prior to the lab draw, she doesn't need to be taking it now (since the level was NORMAL).

## 2011-12-29 NOTE — Telephone Encounter (Signed)
Patient states she has not taken the thyroid meds for 6 months prior to her labs being done, she is advised thyroid normal. She states she wants me to call back to advise if she needs the medication, she started taking it again after her office visit, I advised her we were under the impression she has been taking this, since she listed it in her meds. I told her I will call back to advise, she states she does not understand my chart and was very frustrated after using this, she wants me to call her back and leave message. Phone # [phone]

## 2011-12-30 NOTE — Telephone Encounter (Signed)
lmom with notes and to cb with any questions or concerns

## 2012-01-11 ENCOUNTER — Ambulatory Visit: Payer: Self-pay | Admitting: Gastroenterology

## 2012-01-29 ENCOUNTER — Telehealth: Payer: Self-pay | Admitting: *Deleted

## 2012-01-29 MED ORDER — LEVOTHYROXINE SODIUM 25 MCG PO TABS: 25.0000 ug | ORAL_TABLET | Freq: Every day | ORAL | Status: DC

## 2012-01-29 NOTE — Telephone Encounter (Signed)
Pharmacy requesting refill on synthroid .

## 2012-02-03 ENCOUNTER — Other Ambulatory Visit: Payer: Self-pay | Admitting: Physician Assistant

## 2012-02-24 ENCOUNTER — Other Ambulatory Visit: Payer: Self-pay

## 2012-03-02 ENCOUNTER — Other Ambulatory Visit: Payer: Self-pay | Admitting: Physician Assistant

## 2012-03-18 ENCOUNTER — Other Ambulatory Visit: Payer: Self-pay | Admitting: Physician Assistant

## 2012-04-18 ENCOUNTER — Other Ambulatory Visit: Payer: Self-pay | Admitting: Physician Assistant

## 2012-04-23 ENCOUNTER — Telehealth: Payer: Self-pay | Admitting: Radiology

## 2012-04-23 DIAGNOSIS — G4700 Insomnia, unspecified: Secondary | ICD-10-CM

## 2012-04-23 MED ORDER — CLONAZEPAM 2 MG PO TABS: ORAL_TABLET | ORAL | Status: DC

## 2012-04-23 NOTE — Telephone Encounter (Signed)
When I saw her 12/19/2011, I advised F/U in 6 months. I'll need to see her in June 2014.  Rx printed.  Meds ordered this encounter  Medications  . clonazePAM (KLONOPIN) 2 MG tablet    Sig: Take 1/2 to 1 hs prn.    Dispense:  45 tablet    Refill:  0    Order Specific Question:  Supervising Provider    Answer:  DOOLITTLE, ROBERT P [[id number]]

## 2012-04-23 NOTE — Telephone Encounter (Signed)
Thanks, patient advised she is not due until June. Called in Rx for her.

## 2012-04-23 NOTE — Telephone Encounter (Signed)
Please advise, patient states no insurance and would like her Klonopin filled , she can not come in to the office.  [phone]

## 2012-10-07 ENCOUNTER — Other Ambulatory Visit: Payer: Self-pay | Admitting: Physician Assistant

## 2012-11-14 ENCOUNTER — Other Ambulatory Visit: Payer: Self-pay

## 2012-12-12 ENCOUNTER — Telehealth: Payer: Self-pay

## 2012-12-12 ENCOUNTER — Other Ambulatory Visit: Payer: Self-pay | Admitting: Physician Assistant

## 2012-12-12 MED ORDER — TRAZODONE HCL 50 MG PO TABS: 50.0000 mg | ORAL_TABLET | Freq: Every evening | ORAL | Status: DC | PRN

## 2012-12-12 NOTE — Telephone Encounter (Signed)
[name]   Patient will not have insurance until after the first of the year.  She cannot afford to come and see you even with a 50% discount.   She has to have her traZODone (DESYREL) 50 MG tablet In order to sleep.  She only has four remaining.   She knows she has to have an OV before the script will be refilled.  Patient advised to contact pharmacy for her request.    [phone]

## 2012-12-12 NOTE — Telephone Encounter (Signed)
Noted, called patient advised.

## 2012-12-12 NOTE — Telephone Encounter (Signed)
Pended. Please advise.

## 2012-12-12 NOTE — Telephone Encounter (Signed)
I was notified that this patient was unpleasant to our staff when she called. There a many possible reasons for that, but it is still not acceptable. Please advise me if she continues to be unpleasant with staff.  Meds ordered this encounter  Medications  . traZODone (DESYREL) 50 MG tablet    Sig: Take 1 tablet (50 mg total) by mouth at bedtime as needed for sleep. Need office visit for additional refills.    Dispense:  30 tablet    Refill:  0    Order Specific Question:  Supervising Provider    Answer:  DOOLITTLE, ROBERT P [[id number]]

## 2013-01-23 ENCOUNTER — Ambulatory Visit: Payer: 59

## 2013-01-23 ENCOUNTER — Encounter: Payer: Self-pay | Admitting: Physician Assistant

## 2013-01-23 ENCOUNTER — Ambulatory Visit (INDEPENDENT_AMBULATORY_CARE_PROVIDER_SITE_OTHER): Payer: 59 | Admitting: Physician Assistant

## 2013-01-23 ENCOUNTER — Other Ambulatory Visit: Payer: Self-pay | Admitting: Family Medicine

## 2013-01-23 VITALS — BP 124/86 | HR 83 | Temp 98.7°F | Resp 16 | Ht 68.0 in | Wt 196.0 lb

## 2013-01-23 DIAGNOSIS — R5381 Other malaise: Secondary | ICD-10-CM

## 2013-01-23 DIAGNOSIS — E039 Hypothyroidism, unspecified: Secondary | ICD-10-CM

## 2013-01-23 DIAGNOSIS — M549 Dorsalgia, unspecified: Secondary | ICD-10-CM

## 2013-01-23 DIAGNOSIS — M545 Low back pain, unspecified: Secondary | ICD-10-CM

## 2013-01-23 DIAGNOSIS — F32A Depression, unspecified: Secondary | ICD-10-CM | POA: Insufficient documentation

## 2013-01-23 DIAGNOSIS — R5383 Other fatigue: Secondary | ICD-10-CM

## 2013-01-23 DIAGNOSIS — M542 Cervicalgia: Secondary | ICD-10-CM

## 2013-01-23 DIAGNOSIS — F329 Major depressive disorder, single episode, unspecified: Secondary | ICD-10-CM | POA: Insufficient documentation

## 2013-01-23 DIAGNOSIS — F3289 Other specified depressive episodes: Secondary | ICD-10-CM

## 2013-01-23 DIAGNOSIS — M948X9 Other specified disorders of cartilage, unspecified sites: Secondary | ICD-10-CM

## 2013-01-23 DIAGNOSIS — G4700 Insomnia, unspecified: Secondary | ICD-10-CM

## 2013-01-23 DIAGNOSIS — F419 Anxiety disorder, unspecified: Secondary | ICD-10-CM | POA: Insufficient documentation

## 2013-01-23 DIAGNOSIS — G5600 Carpal tunnel syndrome, unspecified upper limb: Secondary | ICD-10-CM

## 2013-01-23 DIAGNOSIS — M89319 Hypertrophy of bone, unspecified shoulder: Secondary | ICD-10-CM

## 2013-01-23 DIAGNOSIS — G8929 Other chronic pain: Secondary | ICD-10-CM

## 2013-01-23 LAB — CBC WITH DIFFERENTIAL/PLATELET
BASOS PCT: 0 % (ref 0–1)
Basophils Absolute: 0 10*3/uL (ref 0.0–0.1)
EOS ABS: 0.2 10*3/uL (ref 0.0–0.7)
EOS PCT: 1 % (ref 0–5)
HEMATOCRIT: 48.9 % — AB (ref 36.0–46.0)
HEMOGLOBIN: 17.1 g/dL — AB (ref 12.0–15.0)
Lymphocytes Relative: 20 % (ref 12–46)
Lymphs Abs: 2.3 10*3/uL (ref 0.7–4.0)
MCH: 31.5 pg (ref 26.0–34.0)
MCHC: 35 g/dL (ref 30.0–36.0)
MCV: 90.2 fL (ref 78.0–100.0)
MONO ABS: 1.1 10*3/uL — AB (ref 0.1–1.0)
MONOS PCT: 10 % (ref 3–12)
NEUTROS PCT: 69 % (ref 43–77)
Neutro Abs: 7.8 10*3/uL — ABNORMAL HIGH (ref 1.7–7.7)
Platelets: 295 10*3/uL (ref 150–400)
RBC: 5.42 MIL/uL — ABNORMAL HIGH (ref 3.87–5.11)
RDW: 13.1 % (ref 11.5–15.5)
WBC: 11.4 10*3/uL — ABNORMAL HIGH (ref 4.0–10.5)

## 2013-01-23 LAB — POCT SEDIMENTATION RATE: POCT SED RATE: 10 mm/h (ref 0–22)

## 2013-01-23 LAB — COMPREHENSIVE METABOLIC PANEL
ALK PHOS: 101 U/L (ref 39–117)
ALT: 22 U/L (ref 0–35)
AST: 19 U/L (ref 0–37)
Albumin: 4.3 g/dL (ref 3.5–5.2)
BILIRUBIN TOTAL: 0.6 mg/dL (ref 0.3–1.2)
BUN: 14 mg/dL (ref 6–23)
CO2: 24 meq/L (ref 19–32)
CREATININE: 0.51 mg/dL (ref 0.50–1.10)
Calcium: 10 mg/dL (ref 8.4–10.5)
Chloride: 101 mEq/L (ref 96–112)
GLUCOSE: 88 mg/dL (ref 70–99)
Potassium: 4.4 mEq/L (ref 3.5–5.3)
Sodium: 139 mEq/L (ref 135–145)
Total Protein: 7.3 g/dL (ref 6.0–8.3)

## 2013-01-23 LAB — RHEUMATOID FACTOR: Rhuematoid fact SerPl-aCnc: 11 IU/mL (ref <=14)

## 2013-01-23 MED ORDER — TRAZODONE HCL 150 MG PO TABS: 150.0000 mg | ORAL_TABLET | Freq: Every evening | ORAL | Status: DC | PRN

## 2013-01-23 MED ORDER — BACLOFEN 10 MG PO TABS: 10.0000 mg | ORAL_TABLET | Freq: Three times a day (TID) | ORAL | Status: DC

## 2013-01-23 MED ORDER — CLONAZEPAM 2 MG PO TABS: 1.0000 mg | ORAL_TABLET | Freq: Every evening | ORAL | Status: DC | PRN

## 2013-01-23 MED ORDER — VENLAFAXINE HCL ER 37.5 MG PO CP24: ORAL_CAPSULE | ORAL | Status: DC

## 2013-01-23 MED ORDER — HYDROXYZINE HCL 25 MG PO TABS: 12.5000 mg | ORAL_TABLET | Freq: Three times a day (TID) | ORAL | Status: DC | PRN

## 2013-01-23 MED ORDER — MELOXICAM 15 MG PO TABS: ORAL_TABLET | ORAL | Status: DC

## 2013-01-23 NOTE — Patient Instructions (Signed)
I will contact you with your lab results as soon as they are available.   If you have not heard from me in 2 weeks, please contact me.  The fastest way to get your results is to register for My Chart (see the instructions on the last page of this printout).   

## 2013-01-23 NOTE — Progress Notes (Signed)
Subjective:    Patient ID: [name], female    DOB: 04/26/1970, 43 y.o.   MRN: [id number]  PCP: Janiya Millirons, PA-C  Chief Complaint  Patient presents with  . Medication Refill  . Back Pain    also has pain in arms and hands and buttocks   Medications, allergies, past medical history, surgical history, family history, social history and problem list reviewed and updated.  HPI  "Over the last year i've had so much pain."  RIGHT collar bone-pain and popping Both shoulders and upper back, into arms, elbows and wrists Wearing wrist splints for CTS (they're worn out), but don't seem to help anymore. RIGHT low back and into buttock. 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 She's looking into a career change.   Unable to sleep to to pain. Tired all the time. Taking 150 mg of Trazodone is ineffective. And, "my belly hasn't been the same since my hysterectomy, since they put the gas in there."  Flexeril (causes cramping in her muscles) and neurontin without benefit.  Both prescribed at another facility 02/08/2012. Meloxicam was ineffective as well. Melatonin, tryptophan, benadryl without relief from insomnia.  She no longer takes levothyroxine, thinking that she didn't need it. "I want to be happy and I don't want to hurt."  Review of Systems No chest pain, SOB, HA, dizziness, vision change, N/V, diarrhea, constipation, dysuria, urinary urgency or frequency, or rash.     Objective:   Physical Exam  Constitutional: She is oriented to person, place, and time. Vital signs are normal. She appears well-developed and well-nourished. She is active and cooperative. No distress (but tearful).  HENT:  Head: Normocephalic and atraumatic.  Right Ear: Hearing normal.  Left Ear: Hearing normal.  Eyes: EOM are normal. Pupils are equal, round, and reactive to light. No scleral icterus.  Neck: Normal range of motion. Neck supple. No thyromegaly present.    Cardiovascular: Normal rate, regular rhythm and normal heart sounds.   Pulses:      Radial pulses are 2+ on the right side, and 2+ on the left side.       Dorsalis pedis pulses are 2+ on the right side, and 2+ on the left side.       Posterior tibial pulses are 2+ on the right side, and 2+ on the left side.  Pulmonary/Chest: Effort normal and breath sounds normal.    Swelling and crepitus at the sterno-clavicular joint.  No erythema.  Musculoskeletal:       Right shoulder: She exhibits tenderness and pain. She exhibits normal range of motion, no swelling, no effusion and normal strength.       Left shoulder: Normal.       Right elbow: Normal.      Left elbow: Normal.       Right wrist: Normal.       Left wrist: Normal.       Cervical back: She exhibits tenderness and pain. She exhibits normal range of motion, no bony tenderness, no swelling, no edema, no deformity, no laceration and no spasm.       Thoracic back: She exhibits tenderness. She exhibits normal range of motion, no bony tenderness, no swelling, no edema, no deformity, no laceration, no pain and no spasm.       Lumbar back: She exhibits tenderness, bony tenderness and pain. She exhibits normal range of motion, no swelling, no edema, no deformity, no laceration and no spasm.  Back:  Lymphadenopathy:       Head (right side): No tonsillar, no preauricular, no posterior auricular and no occipital adenopathy present.       Head (left side): No tonsillar, no preauricular, no posterior auricular and no occipital adenopathy present.    She has no cervical adenopathy.       Right: No supraclavicular adenopathy present.       Left: No supraclavicular adenopathy present.  Neurological: She is alert and oriented to person, place, and time. No sensory deficit.  Skin: Skin is warm, dry and intact. No rash noted. No cyanosis or erythema. Nails show no clubbing.  Psychiatric: Her speech is normal and behavior is normal. Her mood  appears anxious. Her affect is not angry and not inappropriate. Thought content is not paranoid and not delusional. Cognition and memory are not impaired. She does not express inappropriate judgment. She exhibits a depressed mood. She expresses no homicidal and no suicidal ideation.   CSpine: [facility] reading (PRIMARY) by  Dr. Audria Nine. Degenerative changes at C4-C7.  Loss of lordosis.  No fractures.  LSSpine: [facility] reading (PRIMARY) by  Dr. Audria Nine. Normal alignment.  Decreased disc space at L5-S1. Also degenerative changes noted in the RIGHT SI joint. Large stool noted in the colon.       Assessment & Plan:  1. Hypothyroidism Update lab.  Hypothyroidism could contribute to her depressed mood and myalgias. May need to restart levothyroxine.  Weight is stable.   - TSH  2. Fatigue Await TSH.  Suspect that insomnia due to pain and depression are the etiology of this symptom. - CBC with Differential - Comprehensive metabolic panel  3. Upper back pain, chronic Sleep deprivation and depression likely contribute to this.  Her work contributes. Hypothyroidism could also contribute. - POCT SEDIMENTATION RATE - ANA - Rheumatoid factor - baclofen (LIORESAL) 10 MG tablet; Take 1 tablet (10 mg total) by mouth 3 (three) times daily.  Dispense: 90 each; Refill: 0 - meloxicam (MOBIC) 15 MG tablet; TAKE ONE TABLET BY MOUTH ONCE DAILY  Dispense: 30 tablet; Refill: 2  4. Carpal tunnel syndrome Replace splints. Work exacerbates this.  C-spine degenerative changes are consistent with her arm symptoms as well. She will continue to look at a career change, but may need referral to hand specialist in the meantime. - meloxicam (MOBIC) 15 MG tablet; TAKE ONE TABLET BY MOUTH ONCE DAILY  Dispense: 30 tablet; Refill: 2  5. Neck pain See above.  She does have degenerative changes in the neck that correlate with her symptoms. Consider PT. - DG Cervical Spine Complete; Future - meloxicam (MOBIC) 15 MG tablet;  TAKE ONE TABLET BY MOUTH ONCE DAILY  Dispense: 30 tablet; Refill: 2  6. Clavicle enlargement Meloxicam, as above.   - DG Clavicle Right; Future  7. Low back pain As above. Consider PT. - DG Lumbar Spine Complete; Future - meloxicam (MOBIC) 15 MG tablet; TAKE ONE TABLET BY MOUTH ONCE DAILY  Dispense: 30 tablet; Refill: 2  8. 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 - hydrOXYzine (ATARAX/VISTARIL) 25 MG tablet; Take 0.5-1 tablets (12.5-25 mg total) by mouth every 8 (eight) hours as needed for itching.  Dispense: 30 tablet; Refill: 0 - traZODone (DESYREL) 150 MG tablet; Take 1 tablet (150 mg total) by mouth at bedtime as needed for sleep. Need office visit for additional refills.  Dispense: 30 tablet; Refill: 1  9. Clinical depression Long conversation about how this causes or contributes to most of her other symptoms.   -  venlafaxine XR (EFFEXOR-XR) 37.5 MG 24 hr capsule; Take 1 daily for 1 week, then increase to 2 capsules each morning  Dispense: 60 capsule; Refill: 1 - clonazePAM (KLONOPIN) 2 MG tablet; Take 0.5-1 tablets (1-2 mg total) by mouth at bedtime as needed for anxiety.  Dispense: 45 tablet; Refill: 0  Return in about 4 weeks (around 02/20/2013).  Fernande Brashelle S. Destanee Bedonie, PA-C Physician Assistant-Certified [facility]

## 2013-01-24 ENCOUNTER — Telehealth: Payer: Self-pay

## 2013-01-24 LAB — ANA: Anti Nuclear Antibody(ANA): NEGATIVE

## 2013-01-24 LAB — TSH: TSH: 6.43 u[IU]/mL — AB (ref 0.350–4.500)

## 2013-01-24 NOTE — Telephone Encounter (Signed)
Patient has questions on medications recently prescribed.   Please call [phone]

## 2013-01-25 NOTE — Telephone Encounter (Signed)
Lm for rtn call 

## 2013-01-27 NOTE — Telephone Encounter (Signed)
Spoke with pt, verified her Medication Baclofen should be taken 3 times a day per [employer] Company[employer]helle Jeffery. Pt understood.

## 2013-02-06 ENCOUNTER — Encounter: Payer: Self-pay | Admitting: Physician Assistant

## 2013-02-20 ENCOUNTER — Ambulatory Visit (INDEPENDENT_AMBULATORY_CARE_PROVIDER_SITE_OTHER): Payer: 59 | Admitting: Physician Assistant

## 2013-02-20 VITALS — BP 120/84 | HR 85 | Temp 98.4°F | Resp 16 | Ht 67.0 in | Wt 190.0 lb

## 2013-02-20 DIAGNOSIS — M545 Low back pain, unspecified: Secondary | ICD-10-CM

## 2013-02-20 DIAGNOSIS — R5381 Other malaise: Secondary | ICD-10-CM

## 2013-02-20 DIAGNOSIS — F32A Depression, unspecified: Secondary | ICD-10-CM

## 2013-02-20 DIAGNOSIS — E039 Hypothyroidism, unspecified: Secondary | ICD-10-CM

## 2013-02-20 DIAGNOSIS — M549 Dorsalgia, unspecified: Secondary | ICD-10-CM

## 2013-02-20 DIAGNOSIS — F3289 Other specified depressive episodes: Secondary | ICD-10-CM

## 2013-02-20 DIAGNOSIS — G4700 Insomnia, unspecified: Secondary | ICD-10-CM

## 2013-02-20 DIAGNOSIS — M542 Cervicalgia: Secondary | ICD-10-CM

## 2013-02-20 DIAGNOSIS — F329 Major depressive disorder, single episode, unspecified: Secondary | ICD-10-CM

## 2013-02-20 DIAGNOSIS — G8929 Other chronic pain: Secondary | ICD-10-CM

## 2013-02-20 DIAGNOSIS — R5383 Other fatigue: Secondary | ICD-10-CM

## 2013-02-20 DIAGNOSIS — R531 Weakness: Secondary | ICD-10-CM

## 2013-02-20 DIAGNOSIS — G5600 Carpal tunnel syndrome, unspecified upper limb: Secondary | ICD-10-CM

## 2013-02-20 MED ORDER — CLONAZEPAM 2 MG PO TABS: 1.0000 mg | ORAL_TABLET | Freq: Two times a day (BID) | ORAL | Status: AC

## 2013-02-20 MED ORDER — MELOXICAM 15 MG PO TABS
ORAL_TABLET | ORAL | Status: AC
Start: 2013-02-20 — End: ?

## 2013-02-20 MED ORDER — BACLOFEN 10 MG PO TABS: 10.0000 mg | ORAL_TABLET | Freq: Three times a day (TID) | ORAL | Status: AC | PRN

## 2013-02-20 MED ORDER — CLONAZEPAM 2 MG PO TABS: 1.0000 mg | ORAL_TABLET | Freq: Every evening | ORAL | Status: AC | PRN

## 2013-02-20 MED ORDER — TRAZODONE HCL 150 MG PO TABS
150.0000 mg | ORAL_TABLET | Freq: Every evening | ORAL | Status: AC | PRN
Start: 2013-02-20 — End: ?

## 2013-02-20 MED ORDER — CLONAZEPAM 2 MG PO TABS
1.0000 mg | ORAL_TABLET | Freq: Two times a day (BID) | ORAL | Status: AC
Start: 2013-02-20 — End: ?

## 2013-02-20 MED ORDER — VENLAFAXINE HCL ER 75 MG PO CP24: ORAL_CAPSULE | ORAL | Status: DC

## 2013-02-20 MED ORDER — HYDROXYZINE HCL 25 MG PO TABS: 12.5000 mg | ORAL_TABLET | Freq: Three times a day (TID) | ORAL | Status: AC | PRN

## 2013-02-20 MED ORDER — TRAMADOL HCL 50 MG PO TABS: 50.0000 mg | ORAL_TABLET | Freq: Three times a day (TID) | ORAL | Status: AC | PRN

## 2013-02-20 MED ORDER — LEVOTHYROXINE SODIUM 25 MCG PO TABS: 25.0000 ug | ORAL_TABLET | Freq: Every day | ORAL | Status: AC

## 2013-02-20 NOTE — Progress Notes (Signed)
Subjective:    Patient ID: [name], female    DOB: [birth year], 43 y.o.   MRN: [id number]  PCP: Nyzir Dubois, PA-C  Chief Complaint  Patient presents with  . Follow-up    new meds for pain    Medications, allergies, past medical history, surgical history, family history, social history and problem list reviewed and updated.  HPI  "The Effexor is amazing." No alcohol in 11 days. Reduced appetite on Effexor and has lost 6 pounds. Feels focused, less worried. Other people have noticed how much better she seems.  Neck back and arm pain, pain in the posterior legs, weakness of the upper extremities persists. "Whatever is wrong with me is neurological." Reduced ROM, reduced flexibility.  Moving to [place] temporarily after selling her house. Closing is 03/11/2013. [place] to [place] in 2[place]3 months.  Feels sleepy a lot. Sleeping about 12 hours a day. Drinks energy drinks during the day. Vistaril helps well to help her sleep, though sometimes pain wakes her with position changes.  Review of Systems     Objective:   Physical Exam Blood pressure 120/84, pulse 85, temperature 98.4 F (36.9 C), resp. rate 16, height 5\' 7"  (1.702 m), weight 190 lb (86.183 kg), SpO2 98.00%. Body mass index is 29.75 kg/(m^2). Well-developed, well nourished WF who is awake, alert and oriented, in NAD. HEENT: [place]/AT, sclera and conjunctiva are clear.   Neck: supple, non-tender, no lymphadenopathy, thyromegaly. Heart: RRR, no murmur Lungs: normal effort, CTA Extremities: no cyanosis, clubbing or edema. Decreased strength of the arms bilaterally.  Pain in the arms with raising above the shoulders. Lower extremities with normal strength. Skin: warm and dry without rash. Psychologic: good mood and appropriate affect, normal speech and behavior.        Assessment & Plan:  1. Upper back pain, chronic - baclofen (LIORESAL) 10 MG tablet; Take 1 tablet (10 mg total) by mouth 3  (three) times daily as needed for muscle spasms.  Dispense: 90 each; Refill: 2 - meloxicam (MOBIC) 15 MG tablet; TAKE ONE TABLET BY MOUTH ONCE DAILY  Dispense: 90 tablet; Refill: 1 - traMADol (ULTRAM) 50 MG tablet; Take 1 tablet (50 mg total) by mouth every 8 (eight) hours as needed.  Dispense: 30 tablet; Refill: 0  2. Low back pain - meloxicam (MOBIC) 15 MG tablet; TAKE ONE TABLET BY MOUTH ONCE DAILY  Dispense: 90 tablet; Refill: 1 - traMADol (ULTRAM) 50 MG tablet; Take 1 tablet (50 mg total) by mouth every 8 (eight) hours as needed.  Dispense: 30 tablet; Refill: 0  3. Neck pain - meloxicam (MOBIC) 15 MG tablet; TAKE ONE TABLET BY MOUTH ONCE DAILY  Dispense: 90 tablet; Refill: 1 - traMADol (ULTRAM) 50 MG tablet; Take 1 tablet (50 mg total) by mouth every 8 (eight) hours as needed.  Dispense: 30 tablet; Refill: 0  4. Weakness generalized - Ambulatory referral to Neurology  5. Hypothyroidism Continue current dose of levothyroxine, recently started. Plan repeat upon return from [place], and will adjust dose as indicated.  6. Clinical depression Much improved with the addition of Effexor XR. Continue 75 mg dose. - venlafaxine XR (EFFEXOR-XR) 75 MG 24 hr capsule; Take 1 daily for 1 week, then increase to 2 capsules each morning  Dispense: 90 capsule; Refill: 1  7. Insomnia Much improved. - hydrOXYzine (ATARAX/VISTARIL) 25 MG tablet; Take 0.5-1 tablets (12.5-25 mg total) by mouth every 8 (eight) hours as needed for anxiety or itching.  Dispense: 90 tablet; Refill: 1 -  traZODone (DESYREL) 150 MG tablet; Take 1 tablet (150 mg total) by mouth at bedtime as needed for sleep.  Dispense: 90 tablet; Refill: 1 - clonazePAM (KLONOPIN) 2 MG tablet; Take 0.5-1 tablets (1-2 mg total) by mouth at bedtime as needed for anxiety.  Dispense: 45 tablet; Refill: 0 - clonazePAM (KLONOPIN) 2 MG tablet; Take 0.5-1 tablets (1-2 mg total) by mouth 2 (two) times daily. May fill 30 days after date on prescription   Dispense: 45 tablet; Refill: 0 - clonazePAM (KLONOPIN) 2 MG tablet; Take 0.5-1 tablets (1-2 mg total) by mouth 2 (two) times daily. May fill 60 days after date on prescription  Dispense: 45 tablet; Refill: 0  9. Carpal tunnel syndrome Continue use of night splints. - meloxicam (MOBIC) 15 MG tablet; TAKE ONE TABLET BY MOUTH ONCE DAILY  Dispense: 90 tablet; Refill: 1   Fernande Bras, PA-C Physician Assistant-Certified [facility]

## 2013-02-26 ENCOUNTER — Ambulatory Visit: Payer: 59 | Admitting: Neurology

## 2013-02-26 ENCOUNTER — Telehealth: Payer: Self-pay | Admitting: Neurology

## 2013-02-26 NOTE — Telephone Encounter (Signed)
This patient did not show for a new patient appointment today. 

## 2013-02-27 ENCOUNTER — Telehealth: Payer: Self-pay | Admitting: *Deleted

## 2013-02-27 NOTE — Telephone Encounter (Signed)
In the future, please call to cancel if not going to make appointments. It's up to her if she wants to reschedule before she leaves, or wait until she gets to [place].  I am happy to refer her to a provider in [place], but she'll need to get me the name, etc.  I do not know any providers in [place], so she'll have to find out who she wants to see and let me know.

## 2013-02-27 NOTE — Telephone Encounter (Signed)
spoke to patient, she cancelled due to running a fever and not feeling well, doesn't know if she should reschedule or not because she will be moving to [place] next week.  She is going to be contacting her insurance company to check for coverage in [place]. She also stated you may be able to refer her to neuro in [place]? Please advise.

## 2013-02-27 NOTE — Telephone Encounter (Signed)
Please call this patient. She no showed for her appointment with the neurologist yesterday. What happened?

## 2013-02-28 NOTE — Telephone Encounter (Signed)
Advised pt of recommendation from [name]. She says she is going to have to figure out what she is going to do with her insurance. I advised her once she has that sorted out to please contact our office and let us know what avenue she would like to take. I also told her if she goes with the route of a [place] we would need to know that information in order to place the referral. She understood.

## 2013-04-14 ENCOUNTER — Telehealth: Payer: Self-pay

## 2013-04-14 NOTE — Telephone Encounter (Signed)
[name], Pt would like for you to call in  [facility] [place] [phone] Best# [phone]

## 2013-04-15 MED ORDER — VENLAFAXINE HCL ER 150 MG PO CP24: 150.0000 mg | ORAL_CAPSULE | Freq: Every day | ORAL | Status: AC

## 2013-04-15 NOTE — Telephone Encounter (Signed)
Please call this in  

## 2013-04-15 NOTE — Telephone Encounter (Signed)
Done and pt notified. 

## 2013-10-24 ENCOUNTER — Other Ambulatory Visit: Payer: Self-pay

## 2014-11-16 ENCOUNTER — Telehealth: Payer: Self-pay | Admitting: Physician Assistant

## 2014-11-16 NOTE — Telephone Encounter (Signed)
[name],  This patient called to update you of her condition. She states that she has seen you in the past for chronic back pain and other issues. You mentioned to her that this could be related to thyroid problems. The patient is currently living in [place] and states that her thyroid was absolutely the cause of her back pain. She is so grateful to have been your patient and she is amazed with how thorough you are as a provider. Please feel free to contact the patient at [phone].   Thanks, SYSCOJasmine Cornelius

## 2015-03-25 NOTE — Telephone Encounter (Signed)
FYI - Hold for [employer]Chelle

## 2015-07-09 IMAGING — CR DG CLAVICLE*R*
2 series · 2 of 2 positions shown · non-contrast
Comparison: None.

CLINICAL DATA: Right collar bone pain and popping.

EXAM:
RIGHT CLAVICLE - 2+ VIEWS

[AP (1 of 2)]
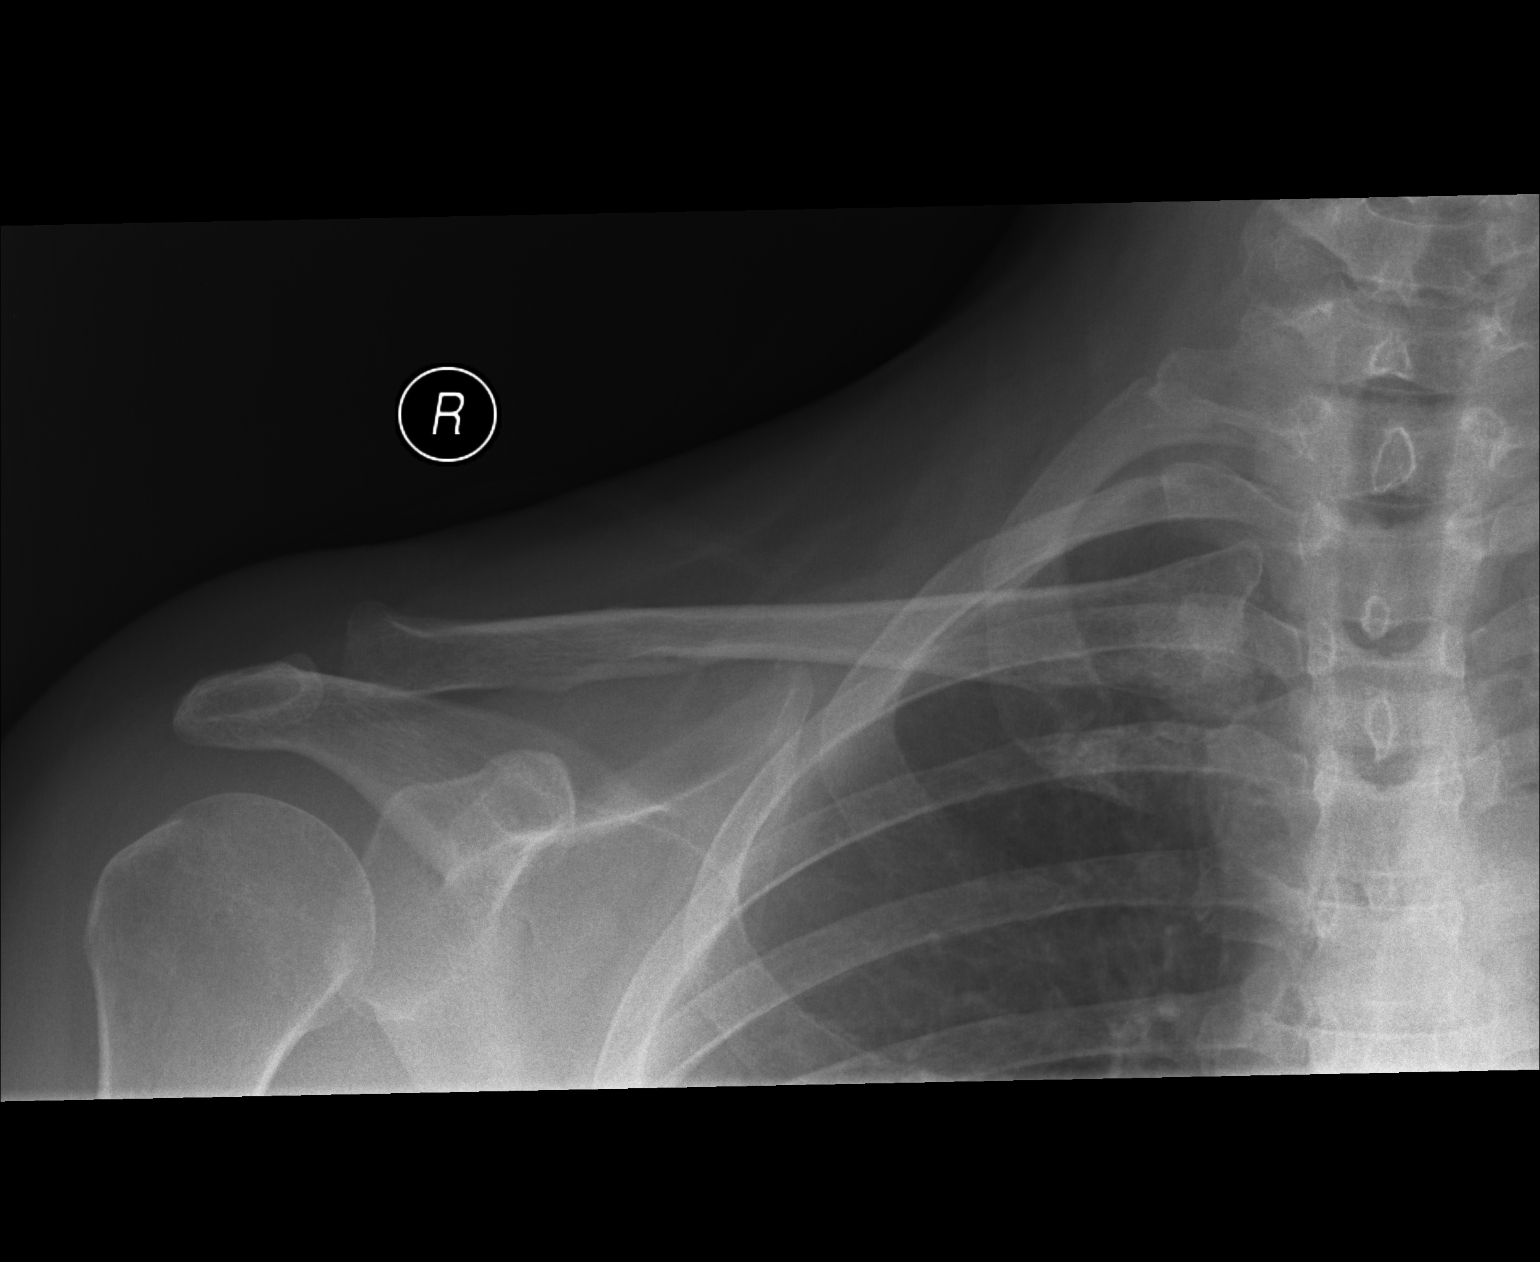

[AP (2 of 2)]
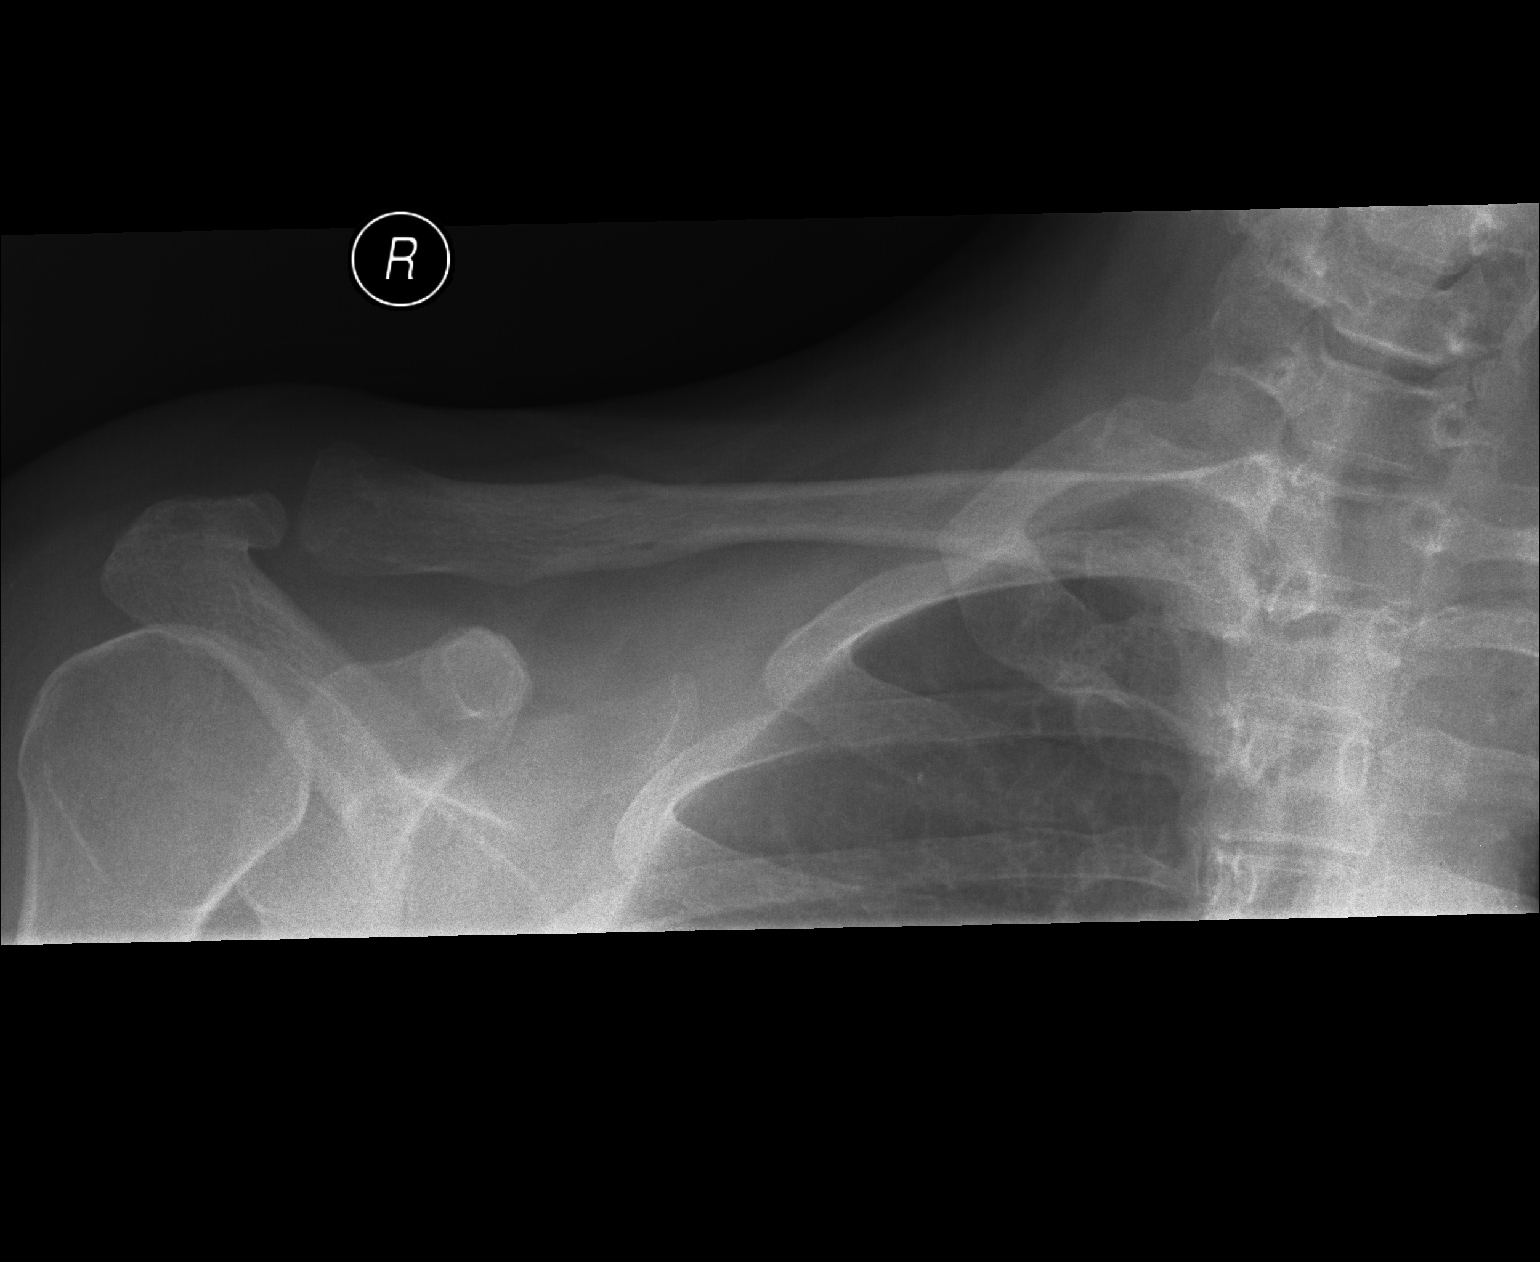

[2 of 2 positions shown; findings below may reference images not displayed]

FINDINGS: There is no evidence of fracture or other focal bone lesions. Soft
tissues are unremarkable.
IMPRESSION: Negative.

## 2017-04-12 ENCOUNTER — Encounter: Payer: Self-pay | Admitting: Physician Assistant
